# Patient Record
Sex: Female | Born: 1965 | Race: White | Hispanic: No | Smoking: Former smoker
Health system: Southern US, Community
[De-identification: ages and names within clinical notes are randomized; demographics above are authoritative.]

## PROBLEM LIST (undated history)

## (undated) DIAGNOSIS — IMO0001 Reserved for inherently not codable concepts without codable children: Secondary | ICD-10-CM

## (undated) DIAGNOSIS — C801 Malignant (primary) neoplasm, unspecified: Secondary | ICD-10-CM

## (undated) DIAGNOSIS — C3490 Malignant neoplasm of unspecified part of unspecified bronchus or lung: Secondary | ICD-10-CM

## (undated) DIAGNOSIS — IMO0002 Reserved for concepts with insufficient information to code with codable children: Secondary | ICD-10-CM

## (undated) HISTORY — DX: Malignant (primary) neoplasm, unspecified: C80.1

## (undated) HISTORY — PX: TUBAL LIGATION: SHX77

## (undated) HISTORY — DX: Reserved for inherently not codable concepts without codable children: IMO0001

## (undated) HISTORY — DX: Reserved for concepts with insufficient information to code with codable children: IMO0002

---

## 2014-02-12 ENCOUNTER — Institutional Professional Consult (permissible substitution): Payer: Self-pay | Admitting: Emergency Medicine

## 2014-02-13 ENCOUNTER — Institutional Professional Consult (permissible substitution): Payer: Self-pay | Admitting: Emergency Medicine

## 2014-02-14 ENCOUNTER — Encounter: Payer: Self-pay | Admitting: Emergency Medicine

## 2014-02-14 ENCOUNTER — Encounter (INDEPENDENT_AMBULATORY_CARE_PROVIDER_SITE_OTHER): Payer: Self-pay

## 2014-02-14 ENCOUNTER — Ambulatory Visit (INDEPENDENT_AMBULATORY_CARE_PROVIDER_SITE_OTHER): Payer: BC Managed Care – PPO | Admitting: Emergency Medicine

## 2014-02-14 VITALS — BP 108/64 | HR 76 | Ht 67.0 in | Wt 112.6 lb

## 2014-02-14 DIAGNOSIS — R222 Localized swelling, mass and lump, trunk: Secondary | ICD-10-CM

## 2014-02-14 DIAGNOSIS — R918 Other nonspecific abnormal finding of lung field: Secondary | ICD-10-CM | POA: Insufficient documentation

## 2014-02-14 NOTE — Assessment & Plan Note (Signed)
Interesting case in that this may represent a second pulmonary primary cancer. If so then she might be a surgical candidate just as she was for her R submandibular gland. We can perform FOB to get tissue, but if we believe H&N cancer is distinct from the lung issue, then surgery referral would be appropriate. I will perform a PET scan at [facility] and then discuss her case at [employer]. Will contact her to decide next steps.

## 2014-02-14 NOTE — Progress Notes (Signed)
Subjective:    Patient ID: Alison Green, female    DOB: 11/14/1966, 48 y.o.   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 The plan is for XRT to that area. MRI 02/05/14 showed no evidence local spread. She is referred today regarding abnormal CT scan chest with a RLL nodule. She denies any cough, CP, dyspnea, or other resp symptoms.    Review of Systems  Constitutional: Positive for unexpected weight change. Negative for fever.  HENT: Negative for congestion, dental problem, ear pain, nosebleeds, postnasal drip, rhinorrhea, sinus pressure, sneezing, sore throat and trouble swallowing.   Eyes: Negative for redness and itching.  Respiratory: Negative for cough, chest tightness, shortness of breath and wheezing.   Cardiovascular: Negative for palpitations and leg swelling.  Gastrointestinal: Negative for nausea and vomiting.  Genitourinary: Negative for dysuria.  Musculoskeletal: Negative for joint swelling.  Skin: Negative for rash.  Neurological: Negative for headaches.  Hematological: Does not bruise/bleed easily.  Psychiatric/Behavioral: Negative for dysphoric mood. The patient is nervous/anxious.     Past Medical History  Diagnosis Date  . Cancer     submandibular     No family history on file.   History   Social History  . Marital Status: Married    Spouse Name: N/A    Number of Children: N/A  . Years of Education: N/A   Occupational History  . Not on file.   Social History Main Topics  . Smoking status: Former Smoker -- 0.50 packs/day for 10 years    Types: Cigarettes    Quit date: 01/27/2014  . Smokeless tobacco: Not on file  . Alcohol Use: No  . Drug Use: No  . Sexual Activity: Not on file   Other Topics Concern  . Not on file   Social History Narrative  . No narrative on file     Not on File   No outpatient prescriptions prior to visit.   No  facility-administered medications prior to visit.         Objective:   Physical Exam  Filed Vitals:   02/14/14 1451  BP: 108/64  Pulse: 76  Height: 5\' 7"  (1.702 m)  Weight: 112 lb 9.6 oz (51.075 kg)  SpO2: 99%   Gen: Pleasant, thin woman, well-nourished, in no distress,  normal affect  ENT: No lesions,  mouth clear,  oropharynx clear, no postnasal drip, healing R submandibular scar  Neck: No JVD, no TMG, no carotid bruits  Lungs: No use of accessory muscles, no dullness to percussion, clear without rales or rhonchi  Cardiovascular: RRR, heart sounds normal, no murmur or gallops, no peripheral edema  Musculoskeletal: No deformities, no cyanosis or clubbing  Neuro: alert, non focal  Skin: Warm, no lesions or rashes   CT chest Oval Linsey) 02/05/14 >> 2.7x3.0cm posterior RLL mass, a few other scattered smaller pulm nodules.      Assessment & Plan:  Right lower lobe lung mass Interesting case in that this may represent a second pulmonary primary cancer. If so then she might be a surgical candidate just as she was for her R submandibular gland. We can perform FOB to get tissue, but if we believe H&N cancer is distinct from the lung issue, then surgery referral would be appropriate. I will perform a PET scan at [facility] and then discuss her case at [employer]. Will contact her to decide next steps.

## 2014-02-14 NOTE — Patient Instructions (Signed)
We will perform a PET scan at [facility] Follow with Dr Lamonte Sakai in 3-4 weeks We will discuss your case at [place]. We may decide to refer you to see Thoracic Surgery. Dr Lamonte Sakai will contact you to discuss these plans.

## 2014-03-01 ENCOUNTER — Telehealth: Payer: Self-pay | Admitting: Emergency Medicine

## 2014-03-01 NOTE — Telephone Encounter (Signed)
Discussed PET results and Thoracic Conference meeting with her. Consensus was that she should have a biopsy of the lung mass before we would consider surgical resection. We need to set up bronchoscopy with the Resp office for next week M, W, or F am. Prefer Friday am at [facility]. No TB risk. I do need fluoro. Call them first thing next week.

## 2014-03-01 NOTE — Telephone Encounter (Addendum)
I spoke with the Alison Green and she states she had a PET scan done in feb at [facility] and has not heard anything from Dr. Lamonte Sakai about the results. Dr. Lamonte Sakai I printed report from pacs system and gave to [facility]. Please advise. [place] Bing, CMA

## 2014-03-04 NOTE — Telephone Encounter (Signed)
Will advise pt and RB that Bronch has been scheduled for 03/08/14 @ 8:00 at [facility]. Nothing further needed at this time

## 2014-03-08 ENCOUNTER — Encounter (HOSPITAL_COMMUNITY)
Admission: RE | Disposition: A | Discharge status: Home / Self Care | Payer: Self-pay | Source: Ambulatory Visit | Attending: Emergency Medicine

## 2014-03-08 ENCOUNTER — Encounter (HOSPITAL_COMMUNITY): Payer: Self-pay

## 2014-03-08 ENCOUNTER — Ambulatory Visit (HOSPITAL_COMMUNITY)
Admission: RE | Admit: 2014-03-08 | Discharge: 2014-03-08 | Disposition: A | Discharge status: Home / Self Care | Payer: BC Managed Care – PPO | Source: Ambulatory Visit | Attending: Emergency Medicine | Admitting: Emergency Medicine

## 2014-03-08 ENCOUNTER — Ambulatory Visit (HOSPITAL_COMMUNITY): Payer: BC Managed Care – PPO

## 2014-03-08 DIAGNOSIS — R918 Other nonspecific abnormal finding of lung field: Secondary | ICD-10-CM

## 2014-03-08 DIAGNOSIS — R222 Localized swelling, mass and lump, trunk: Secondary | ICD-10-CM | POA: Insufficient documentation

## 2014-03-08 DIAGNOSIS — F17200 Nicotine dependence, unspecified, uncomplicated: Secondary | ICD-10-CM | POA: Insufficient documentation

## 2014-03-08 HISTORY — PX: VIDEO BRONCHOSCOPY: SHX5072

## 2014-03-08 SURGERY — BRONCHOSCOPY, WITH FLUOROSCOPY
Anesthesia: Moderate Sedation | Laterality: Bilateral

## 2014-03-08 MED ORDER — LIDOCAINE HCL 2 % EX GEL
CUTANEOUS | Status: DC | PRN
  Administered 2014-03-08: 1

## 2014-03-08 MED ORDER — MIDAZOLAM HCL 5 MG/ML IJ SOLN
INTRAMUSCULAR | Status: AC
  Filled 2014-03-08: qty 2

## 2014-03-08 MED ORDER — FENTANYL CITRATE 0.05 MG/ML IJ SOLN
INTRAMUSCULAR | Status: AC
  Filled 2014-03-08: qty 4

## 2014-03-08 MED ORDER — FENTANYL CITRATE 0.05 MG/ML IJ SOLN
INTRAMUSCULAR | Status: DC | PRN
  Administered 2014-03-08: 50 ug via INTRAVENOUS
  Administered 2014-03-08: 100 ug via INTRAVENOUS
  Administered 2014-03-08: 50 ug via INTRAVENOUS

## 2014-03-08 MED ORDER — LIDOCAINE HCL (PF) 1 % IJ SOLN
INTRAMUSCULAR | Status: DC | PRN
  Administered 2014-03-08: 6 mL

## 2014-03-08 MED ORDER — MIDAZOLAM HCL 10 MG/2ML IJ SOLN
INTRAMUSCULAR | Status: DC | PRN
  Administered 2014-03-08: 2 mg via INTRAVENOUS
  Administered 2014-03-08: 3 mg via INTRAVENOUS

## 2014-03-08 MED ORDER — PHENYLEPHRINE HCL 0.25 % NA SOLN
NASAL | Status: DC | PRN
  Administered 2014-03-08: 2 via NASAL

## 2014-03-08 NOTE — Discharge Instructions (Signed)
Flexible Bronchoscopy, Care After These instructions give you information on caring for yourself after your procedure. Your doctor may also give you more specific instructions. Call your doctor if you have any problems or questions after your procedure. HOME CARE  Do not eat or drink anything for 2 hours after your procedure. If you try to eat or drink before the medicine wears off, food or drink could go into your lungs. You could also burn yourself.   After 2 hours have passed and when you can cough and gag normally, you may eat soft food and drink liquids slowly.  The day after the test, you may eat your normal diet.  You may do your normal activities.  Keep all doctor visits. GET HELP RIGHT AWAY IF:  You get more and more short of breath.  You get lightheaded.  You feel like you are going to pass out (faint).  You have chest pain.  You have new problems that worry you.  You cough up more than a little blood.  You cough up more blood than before. MAKE SURE YOU:  Understand these instructions.  Will watch your condition.  Will get help right away if you are not doing well or get worse. Document Released: 10/10/2009 Document Revised: 10/03/2013 Document Reviewed: 08/17/2013 [facility] Patient Information 2014 [place].  Please call for any questions or concerns. [phone]

## 2014-03-08 NOTE — H&P (View-Only) (Signed)
Subjective:    Patient ID: Alison Green, female    DOB: 20-Jan-1966, 48 y.o.   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 The plan is for XRT to that area. MRI 02/05/14 showed no evidence local spread. She is referred today regarding abnormal CT scan chest with a RLL nodule. She denies any cough, CP, dyspnea, or other resp symptoms.    Review of Systems  Constitutional: Positive for unexpected weight change. Negative for fever.  HENT: Negative for congestion, dental problem, ear pain, nosebleeds, postnasal drip, rhinorrhea, sinus pressure, sneezing, sore throat and trouble swallowing.   Eyes: Negative for redness and itching.  Respiratory: Negative for cough, chest tightness, shortness of breath and wheezing.   Cardiovascular: Negative for palpitations and leg swelling.  Gastrointestinal: Negative for nausea and vomiting.  Genitourinary: Negative for dysuria.  Musculoskeletal: Negative for joint swelling.  Skin: Negative for rash.  Neurological: Negative for headaches.  Hematological: Does not bruise/bleed easily.  Psychiatric/Behavioral: Negative for dysphoric mood. The patient is nervous/anxious.     Past Medical History  Diagnosis Date  . Cancer     submandibular     No family history on file.   History   Social History  . Marital Status: Married    Spouse Name: N/A    Number of Children: N/A  . Years of Education: N/A   Occupational History  . Not on file.   Social History Main Topics  . Smoking status: Former Smoker -- 0.50 packs/day for 10 years    Types: Cigarettes    Quit date: 01/27/2014  . Smokeless tobacco: Not on file  . Alcohol Use: No  . Drug Use: No  . Sexual Activity: Not on file   Other Topics Concern  . Not on file   Social History Narrative  . No narrative on file     Not on File   No outpatient prescriptions prior to visit.   No  facility-administered medications prior to visit.         Objective:   Physical Exam  Filed Vitals:   02/14/14 1451  BP: 108/64  Pulse: 76  Height: 5\' 7"  (1.702 m)  Weight: 112 lb 9.6 oz (51.075 kg)  SpO2: 99%   Gen: Pleasant, thin woman, well-nourished, in no distress,  normal affect  ENT: No lesions,  mouth clear,  oropharynx clear, no postnasal drip, healing R submandibular scar  Neck: No JVD, no TMG, no carotid bruits  Lungs: No use of accessory muscles, no dullness to percussion, clear without rales or rhonchi  Cardiovascular: RRR, heart sounds normal, no murmur or gallops, no peripheral edema  Musculoskeletal: No deformities, no cyanosis or clubbing  Neuro: alert, non focal  Skin: Warm, no lesions or rashes   CT chest Oval Linsey) 02/05/14 >> 2.7x3.0cm posterior RLL mass, a few other scattered smaller pulm nodules.      Assessment & Plan:  Right lower lobe lung mass Interesting case in that this may represent a second pulmonary primary cancer. If so then she might be a surgical candidate just as she was for her R submandibular gland. We can perform FOB to get tissue, but if we believe H&N cancer is distinct from the lung issue, then surgery referral would be appropriate. I will perform a PET scan at [facility] and then discuss her case at [employer]. Will contact her to decide next steps.

## 2014-03-08 NOTE — Interval H&P Note (Signed)
PCCM Interval  No new issues reported. Pt understands the procedure and agrees to proceed.  Filed Vitals:   03/08/14 0755 03/08/14 0800 03/08/14 0805 03/08/14 0809  BP:  116/32 119/74   Pulse: 66 61  59  Resp: 10 10 19 13   SpO2: 100% 100% 100% 100%   Baltazar Apo, MD, PhD 03/08/2014, 8:18 AM [place] [facility] [phone] or if no answer [phone]

## 2014-03-08 NOTE — Progress Notes (Signed)
Video bronchoscopy procedure performed. Brushing intervention performed. Biopsy intervention performed. Wang needle biopsy intervention done. Washing intervention done.  Baltazar Apo, MD, PhD 03/11/2014, 9:35 AM [place] [facility] [phone] or if no answer [phone]

## 2014-03-08 NOTE — Op Note (Signed)
Video Bronchoscopy Procedure Note  Date of Operation: 03/08/2014  Pre-op Diagnosis: LLL mass        Post-op Diagnosis: Same  Surgeon: Baltazar Apo  Assistants: none  Anesthesia: conscious sedation, moderate sedation  Meds Given: fentanyl 257mcg, versed 5mg  in divided doses, 1% lidocaine 24cc total  Operation: Flexible video fiberoptic bronchoscopy and biopsies.  Estimated Blood Loss: 62IW  Complications: none noted  Indications and History: Alison Green is 48 y.o. with history of smoking and LLL lung mass noted on CT scan.  Recommendation was to perform video fiberoptic bronchoscopy with biopsies. The risks, benefits, complications, treatment options and expected outcomes were discussed with the patient.  The possibilities of pneumothorax, pneumonia, reaction to medication, pulmonary aspiration, perforation of a viscus, bleeding, failure to diagnose a condition and creating a complication requiring transfusion or operation were discussed with the patient who freely signed the consent.    Description of Procedure: The patient was seen in the Preoperative Area, was examined and was deemed appropriate to proceed.  The patient was taken to Endoscopy Suite at [facility], identified as Linward Natal and the procedure verified as Flexible Video Fiberoptic Bronchoscopy.  A Time Out was held and the above information confirmed.   Conscious sedation was initiated as indicated above. The video fiberoptic bronchoscope was introduced via the L nare and a general inspection was performed which showed normal cords, normal trachea, normal main carina. The R sided airways were inspected and showed normal RUL, BI, RML and RLL. The L side was then inspected. The LLL, Lingular and LUL airways were normal. Transbronchial brushings were performed under fluoroscopic guidance in several subsegments of the LLL for cytology. Transbronchial Wang needle bx were performed in the same segments. Transbronchial forceps biopsies  were performed under fluoro guidance. There was some initial moderate bleeding that stopped quickly. Finally BAL performed in the LLL to be sent for cytology. The patient tolerated the procedure well. The bronchoscope was removed. There were no obvious complications.   Samples: 1. Transbronchial brushings from LLL 2. Wang needle biopsies from LLL 3. Transbronchial forceps biopsies from LLL 4. Bronchoalveolar Lavage from LLL  Plans:  We will review the cytology, pathology and microbiology results with the patient when they become available.  Outpatient followup will be with Dr Lamonte Sakai.    Baltazar Apo, MD, PhD 03/08/2014, 9:14 AM [place] [facility] [phone] or if no answer [phone]

## 2014-03-10 LAB — CULTURE, BAL-QUANTITATIVE: GRAM STAIN: NONE SEEN

## 2014-03-10 LAB — CULTURE, BAL-QUANTITATIVE W GRAM STAIN

## 2014-03-11 ENCOUNTER — Encounter (HOSPITAL_COMMUNITY): Payer: Self-pay | Admitting: Emergency Medicine

## 2014-03-12 ENCOUNTER — Encounter: Payer: BC Managed Care – PPO | Admitting: Emergency Medicine

## 2014-03-13 ENCOUNTER — Telehealth: Payer: Self-pay | Admitting: Emergency Medicine

## 2014-03-13 NOTE — Telephone Encounter (Signed)
Called spoke with pt. She reports the 1st bronch she had done did not go right. Was told the 2nd one she is schedule for would be free of charge. She reports registration called ysterday wanting money. She wants this clarified. Please advise RB thanks

## 2014-03-13 NOTE — Telephone Encounter (Signed)
Called and spoke with pt. She is aware. Nothing further needed

## 2014-03-13 NOTE — Telephone Encounter (Signed)
Tell her that I will work on this. I have every intention of getting the cost taken care of.

## 2014-03-14 MED ORDER — PHENYLEPHRINE HCL 0.25 % NA SOLN
1.0000 | Freq: Four times a day (QID) | NASAL | Status: DC | PRN
  Filled 2014-03-14: qty 15

## 2014-03-14 MED ORDER — LIDOCAINE HCL 2 % EX GEL: Freq: Once | CUTANEOUS | Status: DC

## 2014-03-15 ENCOUNTER — Ambulatory Visit (HOSPITAL_COMMUNITY)
Admission: RE | Admit: 2014-03-15 | Discharge: 2014-03-15 | Disposition: A | Discharge status: Home / Self Care | Payer: BC Managed Care – PPO | Source: Ambulatory Visit | Attending: Emergency Medicine | Admitting: Emergency Medicine

## 2014-03-15 ENCOUNTER — Ambulatory Visit (HOSPITAL_COMMUNITY): Payer: BC Managed Care – PPO

## 2014-03-15 ENCOUNTER — Encounter (HOSPITAL_COMMUNITY): Payer: Self-pay | Admitting: Respiratory Therapy

## 2014-03-15 ENCOUNTER — Encounter (HOSPITAL_COMMUNITY)
Admission: RE | Disposition: A | Discharge status: Home / Self Care | Payer: BC Managed Care – PPO | Source: Ambulatory Visit | Attending: Emergency Medicine

## 2014-03-15 DIAGNOSIS — C3430 Malignant neoplasm of lower lobe, unspecified bronchus or lung: Secondary | ICD-10-CM | POA: Insufficient documentation

## 2014-03-15 DIAGNOSIS — R222 Localized swelling, mass and lump, trunk: Secondary | ICD-10-CM

## 2014-03-15 DIAGNOSIS — F17200 Nicotine dependence, unspecified, uncomplicated: Secondary | ICD-10-CM | POA: Insufficient documentation

## 2014-03-15 DIAGNOSIS — C080 Malignant neoplasm of submandibular gland: Secondary | ICD-10-CM | POA: Insufficient documentation

## 2014-03-15 DIAGNOSIS — C3490 Malignant neoplasm of unspecified part of unspecified bronchus or lung: Secondary | ICD-10-CM

## 2014-03-15 DIAGNOSIS — R918 Other nonspecific abnormal finding of lung field: Secondary | ICD-10-CM | POA: Diagnosis present

## 2014-03-15 HISTORY — PX: VIDEO BRONCHOSCOPY: SHX5072

## 2014-03-15 HISTORY — DX: Malignant neoplasm of unspecified part of unspecified bronchus or lung: C34.90

## 2014-03-15 SURGERY — BRONCHOSCOPY, WITH FLUOROSCOPY
Anesthesia: Moderate Sedation | Laterality: Bilateral

## 2014-03-15 MED ORDER — LIDOCAINE HCL 2 % EX GEL: Freq: Once | CUTANEOUS | Status: DC

## 2014-03-15 MED ORDER — PHENYLEPHRINE HCL 0.25 % NA SOLN
1.0000 | Freq: Four times a day (QID) | NASAL | Status: DC | PRN
  Filled 2014-03-15: qty 15

## 2014-03-15 MED ORDER — FENTANYL CITRATE 0.05 MG/ML IJ SOLN
INTRAMUSCULAR | Status: DC | PRN
  Administered 2014-03-15 (×2): 25 ug via INTRAVENOUS
  Administered 2014-03-15: 75 ug via INTRAVENOUS

## 2014-03-15 MED ORDER — SODIUM CHLORIDE 0.9 % IV SOLN
INTRAVENOUS | Status: DC
  Administered 2014-03-15: 11:00:00 via INTRAVENOUS

## 2014-03-15 MED ORDER — MIDAZOLAM HCL 10 MG/2ML IJ SOLN
INTRAMUSCULAR | Status: DC | PRN
  Administered 2014-03-15: 2 mg via INTRAVENOUS
  Administered 2014-03-15: 3 mg via INTRAVENOUS
  Administered 2014-03-15: 2 mg via INTRAVENOUS

## 2014-03-15 MED ORDER — MIDAZOLAM HCL 5 MG/ML IJ SOLN
INTRAMUSCULAR | Status: AC
  Filled 2014-03-15: qty 2

## 2014-03-15 MED ORDER — FENTANYL CITRATE 0.05 MG/ML IJ SOLN
INTRAMUSCULAR | Status: AC
  Filled 2014-03-15: qty 4

## 2014-03-15 NOTE — Progress Notes (Signed)
Video Bronchoscopy done  Intervention Bronchial washing done Intervention Bronchial biopsy  X 2 areas Intervention Bronchial bushings    Procedure tolerated well  Baltazar Apo, MD, PhD 03/15/2014, 7:18 PM [place] [facility] [phone] or if no answer [phone]

## 2014-03-15 NOTE — H&P (View-Only) (Signed)
Subjective:    Patient ID: Alison Green, female    DOB: [birth year], 48 y.o.   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 The plan is for XRT to that area. MRI 02/05/14 showed no evidence local spread. She is referred today regarding abnormal CT scan chest with a RLL nodule. She denies any cough, CP, dyspnea, or other resp symptoms.    Review of Systems  Constitutional: Positive for unexpected weight change. Negative for fever.  HENT: Negative for congestion, dental problem, ear pain, nosebleeds, postnasal drip, rhinorrhea, sinus pressure, sneezing, sore throat and trouble swallowing.   Eyes: Negative for redness and itching.  Respiratory: Negative for cough, chest tightness, shortness of breath and wheezing.   Cardiovascular: Negative for palpitations and leg swelling.  Gastrointestinal: Negative for nausea and vomiting.  Genitourinary: Negative for dysuria.  Musculoskeletal: Negative for joint swelling.  Skin: Negative for rash.  Neurological: Negative for headaches.  Hematological: Does not bruise/bleed easily.  Psychiatric/Behavioral: Negative for dysphoric mood. The patient is nervous/anxious.     Past Medical History  Diagnosis Date  . Cancer     submandibular     No family history on file.   History   Social History  . Marital Status: Married    Spouse Name: N/A    Number of Children: N/A  . Years of Education: N/A   Occupational History  . Not on file.   Social History Main Topics  . Smoking status: Former Smoker -- 0.50 packs/day for 10 years    Types: Cigarettes    Quit date: 01/27/2014  . Smokeless tobacco: Not on file  . Alcohol Use: No  . Drug Use: No  . Sexual Activity: Not on file   Other Topics Concern  . Not on file   Social History Narrative  . No narrative on file     Not on File   No outpatient prescriptions prior to visit.   No  facility-administered medications prior to visit.         Objective:   Physical Exam  Filed Vitals:   02/14/14 1451  BP: 108/64  Pulse: 76  Height: 5\' 7"  (1.702 m)  Weight: 112 lb 9.6 oz (51.075 kg)  SpO2: 99%   Gen: Pleasant, thin woman, well-nourished, in no distress,  normal affect  ENT: No lesions,  mouth clear,  oropharynx clear, no postnasal drip, healing R submandibular scar  Neck: No JVD, no TMG, no carotid bruits  Lungs: No use of accessory muscles, no dullness to percussion, clear without rales or rhonchi  Cardiovascular: RRR, heart sounds normal, no murmur or gallops, no peripheral edema  Musculoskeletal: No deformities, no cyanosis or clubbing  Neuro: alert, non focal  Skin: Warm, no lesions or rashes   CT chest Oval Linsey) 02/05/14 >> 2.7x3.0cm posterior RLL mass, a few other scattered smaller pulm nodules.      Assessment & Plan:  Right lower lobe lung mass Interesting case in that this may represent a second pulmonary primary cancer. If so then she might be a surgical candidate just as she was for her R submandibular gland. We can perform FOB to get tissue, but if we believe H&N cancer is distinct from the lung issue, then surgery referral would be appropriate. I will perform a PET scan at [facility] and then discuss her case at [employer]. Will contact her to decide next steps.

## 2014-03-15 NOTE — Op Note (Signed)
Video Bronchoscopy Procedure Note  Date of Operation: 03/15/2014  Pre-op Diagnosis: RLL mass  Post-op Diagnosis: Same  Surgeon: Baltazar Apo  Assistants: none  Anesthesia: conscious sedation, moderate sedation  Meds Given: fentanyl 130mcg, versed 7mg  in divided doses, 1% lidocaine 25cc total  Operation: Flexible video fiberoptic bronchoscopy and biopsies.  Estimated Blood Loss: 62XB  Complications: none noted  Indications and History: Alison Green is 48 y.o. with history of a R submandibular gland tumor that was dx as high grade mucoepidermal carcinoma. She also has a rounded RLL mass on CT chest from 02/05/14.  Recommendation was to perform video fiberoptic bronchoscopy with biopsies. The risks, benefits, complications, treatment options and expected outcomes were discussed with the patient.  The possibilities of pneumothorax, pneumonia, reaction to medication, pulmonary aspiration, perforation of a viscus, bleeding, failure to diagnose a condition and creating a complication requiring transfusion or operation were discussed with the patient who freely signed the consent.    Description of Procedure: The patient was seen in the Preoperative Area, was examined and was deemed appropriate to proceed.  The patient was taken to [facility], identified as Alison Green and the procedure verified as Flexible Video Fiberoptic Bronchoscopy.  A Time Out was held and the above information confirmed.   Conscious sedation was initiated as indicated above. The video fiberoptic bronchoscope was introduced via the L nare and a general inspection was performed which showed normal cords, normal trachea, normal main carina. The R sided airways were inspected and showed normal RUL, BI, RML. There was a very small hypopigmented lesion at the orifice of the medial-basal segment of the RLL that was not seen on her prior FOB 03/08/14. The L side was then inspected. The LLL, Lingular and LUL airways were normal.  Endobronchial forceps biopsies were performed on the RLL area of hypopigmentation for pathology. Then under fluoroscopic guidance transbronchial brushings and transbronchial biopsies were performed in the RLL.  Finally endobronchial washings were performed in the RLL to be sent for cytology. The patient tolerated the procedure well. The bronchoscope was removed. There were no obvious complications. A CXR was ordered and is pending.   Samples: 1. Transbronchial brushings from RLL 2. Transbronchial biopsies from RLL 3. Endobronchial forceps biopsies from RLL 4. Bronchial washings from RLL  Plans:  We will review the cytology, pathology and results with the patient when they become available.  Outpatient followup will be with Dr Lamonte Sakai.    Baltazar Apo, MD, PhD 03/15/2014, 11:55 AM [place] [facility] [phone] or if no answer [phone]

## 2014-03-15 NOTE — Interval H&P Note (Signed)
PCCM Interval Note  Alison Green presents for repeat biopsies via FOB. She tolerated the FOB from 3/13 without difficulty.  No new issues reported.  As expected, all of her samples from her LLL biopsies and brushings are negative  Filed Vitals:   03/15/14 1040 03/15/14 1045 03/15/14 1047  Pulse: 57 71 61  Resp: 12 14 17   SpO2: 100% 100% 100%   Gen: Pleasant, thin, in no distress,  normal affect  ENT: No lesions,  mouth clear,  oropharynx clear, no postnasal drip  Neck: No JVD, no TMG, no carotid bruits  Lungs: No use of accessory muscles, clear without rales or rhonchi  Cardiovascular: RRR, heart sounds normal, no murmur or gallops, no peripheral edema  Musculoskeletal: No deformities, no cyanosis or clubbing  Neuro: alert, non focal  Skin: Warm, no lesions or rashes  Plan:  Repeat FOB with RLL biopsies and washings.   Baltazar Apo, MD, PhD 03/15/2014, 11:09 AM [place] [facility] [phone] or if no answer [phone]

## 2014-03-15 NOTE — Discharge Instructions (Signed)
Flexible Bronchoscopy, Care After These instructions give you information on caring for yourself after your procedure. Your doctor may also give you more specific instructions. Call your doctor if you have any problems or questions after your procedure. HOME CARE  Do not eat or drink anything for 2 hours after your procedure. If you try to eat or drink before the medicine wears off, food or drink could go into your lungs. You could also burn yourself.  After 2 hours have passed and when you can cough and gag normally, you may eat soft food and drink liquids slowly.  The day after the test, you may eat your normal diet.  You may do your normal activities.  Keep all doctor visits. GET HELP RIGHT AWAY IF:  You get more and more short of breath.  You get lightheaded.  You feel like you are going to pass out (faint).  You have chest pain.  You have new problems that worry you.  You cough up more than a little blood.  You cough up more blood than before. MAKE SURE YOU:  Understand these instructions.  Will watch your condition.  Will get help right away if you are not doing well or get worse. Document Released: 10/10/2009 Document Revised: 10/03/2013 Document Reviewed: 08/17/2013 [facility] Patient Information 2014 [place].  Nothing to eat or drink until   2:00 pm Today   03/15/2014

## 2014-03-18 ENCOUNTER — Encounter (HOSPITAL_COMMUNITY): Payer: Self-pay | Admitting: Emergency Medicine

## 2014-03-19 ENCOUNTER — Telehealth: Payer: Self-pay | Admitting: Emergency Medicine

## 2014-03-19 NOTE — Telephone Encounter (Signed)
Reviewed path with her by phone - shows mucoepidermal tumor that matches her submandibular gland. I will forward this info to Dr Dorisann Frames, [facility]

## 2014-03-20 ENCOUNTER — Telehealth: Payer: Self-pay | Admitting: Emergency Medicine

## 2014-03-20 NOTE — Telephone Encounter (Signed)
Last OV note printed and faxed [phone] Attn: Manuela Schwartz. They are aware these notes being faxed. Nothing further needed

## 2014-03-27 MED ORDER — LEVOFLOXACIN 750 MG PO TABS: 750.0000 mg | ORAL_TABLET | Freq: Every day | ORAL | Status: DC

## 2014-03-27 NOTE — Telephone Encounter (Signed)
Pt aware levaquin [phone] sent to pharm.  Pt has already had chest cxr at urgent care and will fax a copy of the results to [facility]. Pt also stated followed up with Dr. Bobby Rumpf at [facility] on Friday 03/22/14. Informed her I would call there and f/u that Dr. Orlene Erm is involved and aware pt has been seen, he has reviewed her results of biopsy

## 2014-03-27 NOTE — Telephone Encounter (Signed)
ATC pt x2 - line busy both times.  WCB.

## 2014-04-04 LAB — FUNGUS CULTURE W SMEAR: Fungal Smear: NONE SEEN

## 2014-04-15 ENCOUNTER — Encounter (HOSPITAL_COMMUNITY): Payer: Self-pay

## 2014-04-19 ENCOUNTER — Encounter (HOSPITAL_COMMUNITY): Payer: Self-pay

## 2014-04-21 LAB — AFB CULTURE WITH SMEAR (NOT AT ARMC): Acid Fast Smear: NONE SEEN

## 2014-04-23 ENCOUNTER — Encounter: Payer: Self-pay | Admitting: Radiation Oncology

## 2014-04-23 NOTE — Progress Notes (Signed)
Thoracic Location of Tumor / Histology:Right Lower lobe lung Cancer  Patient presented  months ago with symptoms Alison Green states she didn't have a PCP and decided to have a physical especially after developing a palpable mass on right side of neck that became painful.Prior to this she had no health issues.  Biopsies of  (if applicable) revealed: Diagnosis 03/15/14:1. Lung, transbronchial biopsy, Right lower lobe- MUCOEPIDERMOID CARCINOMA.- LYMPHOVASCULAR INVASION IS IDENTIFIED. Diagnosis(continued)- SEE COMMENT.2. Endobronchial biopsy, Right lower lobe medial basal segment- MUCOEPIDERMOID CARCINOMA. Dr. Collene Gobble   Tobacco/Marijuana/Snuff/ETOH use: former smoker 10 years 1/2ppd/ Quit March 23, 2014  Past/Anticipated interventions by cardiothoracic surgery, if DXA:JOIN biopsy and bronchoscopy  On 03/08/2014 and 3/201/2015  Past/Anticipated interventions by medical oncology, if any: Dr.Lewis is medical oncologist in [place].  Signs/Symptoms  Weight changes, if any: weight maintained.  Respiratory complaints, if any: no  Hemoptysis, if any: no  Pain issues, if any:No   SAFETY ISSUES:  Prior radiation? No  Pacemaker/ICD? No  Possible current pregnancy? No  Is the patient on methotrexate? No  Current Complaints / other details:  Married, 2 adult children ages 1 and 19.  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

## 2014-04-24 ENCOUNTER — Ambulatory Visit
Admission: RE | Admit: 2014-04-24 | Discharge: 2014-04-24 | Disposition: A | Discharge status: Home / Self Care | Payer: BC Managed Care – PPO | Source: Ambulatory Visit | Attending: Radiation Oncology | Admitting: Radiation Oncology

## 2014-04-24 ENCOUNTER — Encounter: Payer: Self-pay | Admitting: Radiation Oncology

## 2014-04-24 ENCOUNTER — Ambulatory Visit: Payer: BC Managed Care – PPO | Admitting: Radiation Oncology

## 2014-04-24 VITALS — BP 114/67 | HR 63 | Temp 98.7°F | Wt 111.3 lb

## 2014-04-24 DIAGNOSIS — C3431 Malignant neoplasm of lower lobe, right bronchus or lung: Secondary | ICD-10-CM

## 2014-04-24 DIAGNOSIS — C3430 Malignant neoplasm of lower lobe, unspecified bronchus or lung: Secondary | ICD-10-CM | POA: Insufficient documentation

## 2014-04-24 DIAGNOSIS — Z510 Encounter for antineoplastic radiation therapy: Secondary | ICD-10-CM | POA: Insufficient documentation

## 2014-04-24 HISTORY — DX: Malignant neoplasm of unspecified part of unspecified bronchus or lung: C34.90

## 2014-04-24 NOTE — Progress Notes (Signed)
Please see the Nurse Progress Note in the MD Initial Consult Encounter for this patient. 

## 2014-04-25 DIAGNOSIS — C3430 Malignant neoplasm of lower lobe, unspecified bronchus or lung: Secondary | ICD-10-CM | POA: Insufficient documentation

## 2014-04-25 NOTE — Progress Notes (Addendum)
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 female  palpated a right neck mass. She noticed this last year. This was associated with pain to the point where she was having trismus. She was referred to ENT. In January she had resection of a submandibular mass. The pathology showed a 2.3 cm mucoid epidermoid carcinoma which was high grade. Perineural and lymphovascular invasion was noted. Zero out of two lymph nodes were positive for malignancy. Tumor was present at multiple non-oriented inked edges. At that point she had lost about 10 pounds in the past 12 months. Further staging studies were performed including a PET scan which was performed on 02/18/2014. This showed a 2.7 x 3.2 cm hypermetabolic mass in the right lower lobe. This had an SUV of 16.9. A small adjacent nodule measuring 6 mm had no hypermetabolic activity. No evidence of metastatic disease was noted. She underwent bronchoscopy and biopsy of the right lower lobe lesions on 03/15/2014 which showed mucoepidermoid carcinoma. 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 She is present with her husband today. She has no complaints. Per Dr. Jaclyn Shaggy notes she does have recent neck imaging which shows a stable jugulodigastric node with no evidence of recurrent disease. Her case was discussed at [facility] tumor board as well as our head and neck tumor board. Stereotactic radiation to the lung mass with possible radiation to the right neck was discussed. She is referred to me by Dr. Bobby Rumpf for consideration of stereotactic radiation to  this mass. She has no cough no shortness of breath and no hemoptysis.  PREVIOUS RADIATION THERAPY: No  PAST MEDICAL HISTORY:  has a past medical history of Lung cancer (03/15/14) and Cancer.    PAST SURGICAL HISTORY: Past Surgical History  Procedure Laterality Date  . Tubal ligation    . Video bronchoscopy Bilateral 03/08/2014    Procedure: VIDEO BRONCHOSCOPY WITH FLUORO;  Surgeon: Collene Gobble, MD;  Location: [facility];  Service: Cardiopulmonary;  Laterality: Bilateral;  . Video bronchoscopy Bilateral 03/15/2014    Procedure: VIDEO BRONCHOSCOPY WITH FLUORO;  Surgeon: Collene Gobble, MD;  Location: [facility];  Service: Cardiopulmonary;  Laterality: Bilateral;    FAMILY HISTORY: No family history on file.  SOCIAL HISTORY:  History  Substance Use Topics  . Smoking status: Former Smoker -- 0.50 packs/day for 10 years    Types: Cigarettes    Quit date: 01/27/2014  . Smokeless tobacco: Not on file  . Alcohol Use: No    ALLERGIES: Review of patient's allergies indicates no known allergies.  MEDICATIONS:  Current Outpatient Prescriptions  Medication Sig Dispense Refill  . buPROPion (ZYBAN) 150 MG 12 hr tablet Take 150 mg by mouth 2 (two) times a week.       . diphenhydrAMINE (SIMPLY SLEEP) 25 MG tablet Take 25 mg by mouth at bedtime as needed for sleep.       No current facility-administered medications for this encounter.    REVIEW OF SYSTEMS:  A 15 point review of systems is documented in the electronic medical record. This was obtained by the  nursing staff. However, I reviewed this with the patient to discuss relevant findings and make appropriate changes.  Pertinent items are noted in HPI.  PHYSICAL EXAM:  Filed Vitals:   04/24/14 1210  BP: 114/67  Pulse: 63  Temp: 98.7 F (37.1 C)  .111 lb 4.8 oz (50.485 kg). Pleasant female in no distress sitting comfortably examining table. Alert and oriented x3. 5 out of 5 strength bilaterally.   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 We discussed her solitary pulmonary metastases. In light of the aggressive nature of this disease and its poor response to systemic chemotherapy we discussed treatment of this solitary metastases. I think this can be accomplished with minimal toxicity to her. Given its central location and 5-10 fractions of radiation given in a stereotactic fashion is a reasonable treatment plan. I fear the toxicity of systemic treatment with the low likelihood of response would put this lung at risk for collapse and likely be ineffective in preventing further metastatic disease. Obviously as I discussed with she and her husband and she had a more typical non-small cell lung cancer the standard would be to start with systemic chemotherapy but again the likelihood of response to systemic chemotherapy given this pathology is low. For that reason we discussed stereotactic radiation to this right lower lobe lesion. We discussed the use of  Respiratory compression and 4D CT. I actually is scheduled her for simulation following this appointment. She signed informed consent and agree to proceed forward. Risk and benefits of treatment were discussed with her and she was given the opportunity ask questions. I spent 60 minutes  face to face with the patient and more than 50% of that time was spent in counseling and/or coordination of care.  ADDENDUM: after review of her outside studies, it does appear she has several pulmonary metastases including 2 in the right lower lobe, 2 in the left lower lobe. This was reviewed at our multidisciplinary lung conference and she is not a candidate for resection of the masses given the bilateral nature. I discussed this with her and she still wishes to pursue SBRT to the right lower lobe masses give that they could cause airway obstruction  if they grew and she really does not have good systemic treatment options to prevent this growth.     ------------------------------------------------  Thea Silversmith, MD

## 2014-04-25 NOTE — Progress Notes (Signed)
[facility] Simulation and Treatment Planning Note   Name:  Alison Green MRN: [id number]   Date: 04/25/2014  DOB: 03-13-1966  Status:outpatient    DIAGNOSIS: Metastatic mucoepidermoid cancer to the right lower lobe.    CONSENT VERIFIED:yes   SET UP: Patient is setup supine   IMMOBILIZATION: The patient was immobilized using a Vac Loc bag and Abdominal Compression.   NARRATIVE:The patient was brought to the [facility].  Identity was confirmed.  All relevant records and images related to the planned course of therapy were reviewed.  Then, the patient was positioned in a stable reproducible clinical set-up for radiation therapy. Abdominal compression was applied by me.  4D CT images were obtained and reproducible breathing pattern was confirmed. Free breathing CT images were obtained.  Skin markings were placed.  The CT images were loaded into the planning software where the target and avoidance structures were contoured.  The radiation prescription was entered and confirmed.    TREATMENT PLANNING NOTE:  Treatment planning then occurred. I have requested : MLC's, isodose plan, basic dose calculation.  3 dimensional simulation is performed and dose volume histogram of the gross tumor volume, planning tumor volume and criticial normal structures including the spinal cord and lungs were analyzed and requested.  Special treatment procedure was performed due to high dose per fraction.  The patient will be monitored for increased risk of toxicity.  Daily imaging using cone beam CT will be used for target localization.

## 2014-04-25 NOTE — Addendum Note (Signed)
Encounter addended by: Arlyss Repress, RN on: 04/25/2014  1:28 PM<BR>     Documentation filed: Charges VN

## 2014-05-07 ENCOUNTER — Ambulatory Visit
Admission: RE | Admit: 2014-05-07 | Discharge: 2014-05-07 | Disposition: A | Discharge status: Home / Self Care | Payer: BC Managed Care – PPO | Source: Ambulatory Visit | Attending: Radiation Oncology | Admitting: Radiation Oncology

## 2014-05-07 ENCOUNTER — Ambulatory Visit: Payer: BC Managed Care – PPO | Admitting: Radiation Oncology

## 2014-05-07 DIAGNOSIS — C3430 Malignant neoplasm of lower lobe, unspecified bronchus or lung: Secondary | ICD-10-CM

## 2014-05-07 NOTE — Progress Notes (Signed)
I spoke with the patient and her husband about her treatment. I reviewed her plan and the proximity of high doses of radiation in and around some distal branches of the right lower lobe bronchus.  We discussed the implications for this. We discussed the presence of other nodules in her lungs. We discussed the pros and cons of surgery.  In the end she elected to proceed forward with treatment and accept the small but real risk of lower lobe collapse which would be unlikely to cause her to stop breathing but possibly could cause her to have a chronic cough.

## 2014-05-07 NOTE — Progress Notes (Signed)
No nurse note required as patient to be seen by dr.Wentworth only in office for discussion.

## 2014-05-08 ENCOUNTER — Ambulatory Visit: Payer: BC Managed Care – PPO

## 2014-05-09 ENCOUNTER — Ambulatory Visit
Admission: RE | Admit: 2014-05-09 | Discharge: 2014-05-09 | Disposition: A | Discharge status: Home / Self Care | Payer: BC Managed Care – PPO | Source: Ambulatory Visit | Attending: Radiation Oncology | Admitting: Radiation Oncology

## 2014-05-09 DIAGNOSIS — C3430 Malignant neoplasm of lower lobe, unspecified bronchus or lung: Secondary | ICD-10-CM

## 2014-05-09 NOTE — Progress Notes (Signed)
  Radiation Oncology         [phone] ________________________________  Name: Alison Green MRN: [id number]  Date: 05/09/2014  DOB: [birth year]  Stereotactic Body Radiotherapy Treatment Procedure Note  NARRATIVE:  Alison Green was brought to the stereotactic radiation treatment machine and placed supine on the CT couch. The patient was set up for stereotactic body radiotherapy on the body fix pillow.  3D TREATMENT PLANNING AND DOSIMETRY:  The patient's radiation plan was reviewed and approved prior to starting treatment.  It showed 3-dimensional radiation distributions overlaid onto the planning CT.  The [facility] for the target structures as well as the organs at risk were reviewed. The documentation of this is filed in the radiation oncology EMR.  SIMULATION VERIFICATION:  The patient underwent CT imaging on the treatment unit.  These were carefully aligned to document that the ablative radiation dose would cover the target volume and maximally spare the nearby organs at risk according to the planned distribution.  SPECIAL TREATMENT PROCEDURE: Alison Green received high dose ablative stereotactic body radiotherapy to the planned target volume without unforeseen complications. Treatment was delivered uneventfully. The high doses associated with stereotactic body radiotherapy and the significant potential risks require careful treatment set up and patient monitoring constituting a special treatment procedure   STEREOTACTIC TREATMENT MANAGEMENT:  Following delivery, the patient was evaluated clinically. The patient tolerated treatment without significant acute effects, and was discharged to home in stable condition.    PLAN: Continue treatment as planned.  ________________________________  Blair Promise, PhD, MD

## 2014-05-10 ENCOUNTER — Ambulatory Visit: Payer: BC Managed Care – PPO

## 2014-05-13 ENCOUNTER — Ambulatory Visit: Payer: BC Managed Care – PPO | Admitting: Radiation Oncology

## 2014-05-13 ENCOUNTER — Ambulatory Visit
Admission: RE | Admit: 2014-05-13 | Discharge: 2014-05-13 | Disposition: A | Discharge status: Home / Self Care | Payer: BC Managed Care – PPO | Source: Ambulatory Visit | Attending: Radiation Oncology | Admitting: Radiation Oncology

## 2014-05-13 ENCOUNTER — Encounter: Payer: Self-pay | Admitting: Radiation Oncology

## 2014-05-13 NOTE — Progress Notes (Signed)
  Radiation Oncology         [phone] ________________________________  Name: Alison Green MRN: [id number]  Date: 05/13/2014  DOB: Jun 18, 1966  Stereotactic Body Radiotherapy Treatment Procedure Note  NARRATIVE:  Alison Green was brought to the stereotactic radiation treatment machine and placed supine on the CT couch. The patient was set up for stereotactic body radiotherapy on the body fix pillow.  3D TREATMENT PLANNING AND DOSIMETRY:  The patient's radiation plan was reviewed and approved prior to starting treatment.  It showed 3-dimensional radiation distributions overlaid onto the planning CT.  The [facility] for the target structures as well as the organs at risk were reviewed. The documentation of this is filed in the radiation oncology EMR.  SIMULATION VERIFICATION:  The patient underwent CT imaging on the treatment unit.  These were carefully aligned to document that the ablative radiation dose would cover the  right lung target volume and maximally spare the nearby organs at risk according to the planned distribution.  SPECIAL TREATMENT PROCEDURE: Alison Green received high dose ablative stereotactic body radiotherapy to the planned target volume without unforeseen complications. Treatment was delivered uneventfully. She received an additional 1000 cGy for a cumulative dose of 2000 cGy in 2 sessions of a prescribed 5000 cGy in 5 sessions. The high doses associated with stereotactic body radiotherapy and the significant potential risks require careful treatment set up and patient monitoring constituting a special treatment procedure   STEREOTACTIC TREATMENT MANAGEMENT:  Following delivery, the patient was evaluated clinically. The patient tolerated treatment without significant acute effects, and was discharged to home in stable condition.    PLAN: Continue treatment as planned.  ------------------------------------------------        Rexene Edison, MD

## 2014-05-15 ENCOUNTER — Ambulatory Visit: Payer: BC Managed Care – PPO | Admitting: Radiation Oncology

## 2014-05-15 ENCOUNTER — Ambulatory Visit
Admission: RE | Admit: 2014-05-15 | Discharge: 2014-05-15 | Disposition: A | Discharge status: Home / Self Care | Payer: BC Managed Care – PPO | Source: Ambulatory Visit | Attending: Radiation Oncology | Admitting: Radiation Oncology

## 2014-05-15 DIAGNOSIS — C3430 Malignant neoplasm of lower lobe, unspecified bronchus or lung: Secondary | ICD-10-CM

## 2014-05-15 NOTE — Progress Notes (Signed)
  Radiation Oncology         (336) [phone] ________________________________  Name: Alison Green MRN: [id number]  Date: 05/15/2014  DOB: Sep 24, 1966  Stereotactic Body Radiotherapy Treatment Procedure Note  NARRATIVE:  Alison Green was brought to the stereotactic radiation treatment machine and placed supine on the CT couch. The patient was set up for stereotactic body radiotherapy on the body fix pillow.  3D TREATMENT PLANNING AND DOSIMETRY:  The patient's radiation plan was reviewed and approved prior to starting treatment.  It showed 3-dimensional radiation distributions overlaid onto the planning CT.  The [facility] for the target structures as well as the organs at risk were reviewed. The documentation of this is filed in the radiation oncology EMR.  SIMULATION VERIFICATION:  The patient underwent CT imaging on the treatment unit.  These were carefully aligned to document that the ablative radiation dose would cover the target volume and maximally spare the nearby organs at risk according to the planned distribution.  SPECIAL TREATMENT PROCEDURE: Alison Green received high dose ablative stereotactic body radiotherapy to the planned target volume without unforeseen complications. Treatment was delivered uneventfully. The high doses associated with stereotactic body radiotherapy and the significant potential risks require careful treatment set up and patient monitoring constituting a special treatment procedure   STEREOTACTIC TREATMENT MANAGEMENT:  Following delivery, the patient was evaluated clinically. The patient tolerated treatment without significant acute effects, and was discharged to home in stable condition.    PLAN: Continue treatment as planned.  ________________________________  Blair Promise, PhD, MD

## 2014-05-17 ENCOUNTER — Ambulatory Visit: Payer: BC Managed Care – PPO | Admitting: Radiation Oncology

## 2014-05-17 ENCOUNTER — Ambulatory Visit
Admission: RE | Admit: 2014-05-17 | Discharge: 2014-05-17 | Disposition: A | Discharge status: Home / Self Care | Payer: BC Managed Care – PPO | Source: Ambulatory Visit | Attending: Radiation Oncology | Admitting: Radiation Oncology

## 2014-05-17 DIAGNOSIS — C3430 Malignant neoplasm of lower lobe, unspecified bronchus or lung: Secondary | ICD-10-CM

## 2014-05-17 NOTE — Progress Notes (Signed)
   Radiation Oncology         [phone] ________________________________  Name: Alison Green MRN: [id number]  Date: 05/17/2014  DOB: 07-31-1966  Stereotactic Body Radiotherapy Treatment Procedure Note   NARRATIVE: Alison Green was brought to the stereotactic radiation treatment machine and placed supine on the CT couch. The patient was set up for stereotactic body radiotherapy on the body fix pillow.   3D TREATMENT PLANNING AND DOSIMETRY: The patient's radiation plan was reviewed and approved prior to starting treatment. It showed 3-dimensional radiation distributions overlaid onto the planning CT. The [facility] for the target structures as well as the organs at risk were reviewed. The documentation of this is filed in the radiation oncology EMR.   SIMULATION VERIFICATION: The patient underwent CT imaging on the treatment unit. These were carefully aligned to document that the ablative radiation dose would cover the target volume and maximally spare the nearby organs at risk according to the planned distribution.   SPECIAL TREATMENT PROCEDURE: Alison Green received high dose ablative stereotactic body radiotherapy to the planned target volume without unforeseen complications. Treatment was delivered uneventfully. The high doses associated with stereotactic body radiotherapy and the significant potential risks require careful treatment set up and patient monitoring constituting a special treatment procedure.   STEREOTACTIC TREATMENT MANAGEMENT: Following delivery, the patient was evaluated clinically. The patient tolerated treatment without significant acute effects, and was discharged to home in stable condition.   PLAN: Continue treatment as planned.    Fraction: 4  Dose:  40 Gy   ________________________________  Jodelle Gross, MD, PhD

## 2014-05-21 ENCOUNTER — Ambulatory Visit
Admission: RE | Admit: 2014-05-21 | Discharge: 2014-05-21 | Disposition: A | Discharge status: Home / Self Care | Payer: BC Managed Care – PPO | Source: Ambulatory Visit | Attending: Radiation Oncology | Admitting: Radiation Oncology

## 2014-05-21 ENCOUNTER — Encounter: Payer: Self-pay | Admitting: Radiation Oncology

## 2014-05-21 VITALS — BP 119/89 | HR 84 | Temp 98.6°F | Wt 111.2 lb

## 2014-05-21 DIAGNOSIS — C3430 Malignant neoplasm of lower lobe, unspecified bronchus or lung: Secondary | ICD-10-CM

## 2014-05-21 NOTE — Progress Notes (Signed)
  Radiation Oncology         (336) [phone] ________________________________  Name: Alison Green MRN: [id number]  Date: 05/21/2014  DOB: 1966-03-05  Stereotactic Body Radiotherapy Treatment Procedure Note  NARRATIVE:  CALLY NYGARD was brought to the stereotactic radiation treatment machine and placed supine on the CT couch. The patient was set up for stereotactic body radiotherapy on the body fix pillow.  3D TREATMENT PLANNING AND DOSIMETRY:  The patient's radiation plan was reviewed and approved prior to starting treatment.  It showed 3-dimensional radiation distributions overlaid onto the planning CT.  The [facility] for the target structures as well as the organs at risk were reviewed. The documentation of this is filed in the radiation oncology EMR.  SIMULATION VERIFICATION:  The patient underwent CT imaging on the treatment unit.  These were carefully aligned to document that the ablative radiation dose would cover the target volume and maximally spare the nearby organs at risk according to the planned distribution.  SPECIAL TREATMENT PROCEDURE: AMIEE WILEY received high dose ablative stereotactic body radiotherapy to the planned target volume without unforeseen complications. Treatment was delivered uneventfully. The high doses associated with stereotactic body radiotherapy and the significant potential risks require careful treatment set up and patient monitoring constituting a special treatment procedure   STEREOTACTIC TREATMENT MANAGEMENT:  Following delivery, the patient was evaluated clinically. The patient tolerated treatment without significant acute effects, and was discharged to home in stable condition.    PLAN: Continue treatment as planned.  _________________________   Thea Silversmith, MD

## 2014-05-21 NOTE — Progress Notes (Signed)
  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  Narrative: The patient tolerated radiation treatment relatively well.   She had minimal cough  Plan: The patient has completed radiation treatment. The patient will return to radiation oncology clinic for routine followup in July after a CT scan at [facility]. I have also referred her to [facility] for an opinion regarding her primary cancer at her request.  I advised her to call or return sooner if they have any questions or concerns related to her recovery or treatment.  ------------------------------------------------  Thea Silversmith, MD

## 2014-05-21 NOTE — Progress Notes (Signed)
Patient has completed 5 SBRT treatments to right lower lobe.Has cough and shortness of breath only initially after treatment then it clears.Denies pain.No visible skin changes.Mild fatigue.Given appointment card to schedule follow up in one month.Knows to call if any questions or concerns regarding treatment.

## 2014-05-22 ENCOUNTER — Telehealth: Payer: Self-pay | Admitting: *Deleted

## 2014-05-22 NOTE — Telephone Encounter (Signed)
Called patient to inform of test on 06/26/14 - arrival time - 12:30 pm @ [facility] (Radiology) and her ENT visit with Dr. Nicolette Bang on 06-04-14- arrival time 10:15 am , spoke with patient and she is aware of these appts.

## 2014-05-23 NOTE — Addendum Note (Signed)
Encounter addended by: Thea Silversmith, MD on: 05/23/2014  5:55 PM<BR>     Documentation filed: Clinical Notes, Notes Section

## 2014-06-01 ENCOUNTER — Ambulatory Visit: Payer: BC Managed Care – PPO | Admitting: Radiation Oncology

## 2014-06-07 ENCOUNTER — Telehealth: Payer: Self-pay | Admitting: *Deleted

## 2014-06-07 ENCOUNTER — Other Ambulatory Visit: Payer: Self-pay | Admitting: Radiation Oncology

## 2014-06-07 DIAGNOSIS — C3430 Malignant neoplasm of lower lobe, unspecified bronchus or lung: Secondary | ICD-10-CM

## 2014-06-07 NOTE — Telephone Encounter (Signed)
Called patient to inform of additional CT on 06-26-14 @ [facility], - arrival time - 12:30 pm, spoke with patient and she is aware of this test.

## 2014-06-26 ENCOUNTER — Ambulatory Visit (HOSPITAL_COMMUNITY): Payer: BC Managed Care – PPO

## 2014-06-27 ENCOUNTER — Ambulatory Visit: Payer: BC Managed Care – PPO | Admitting: Radiation Oncology

## 2014-07-04 ENCOUNTER — Ambulatory Visit
Admission: RE | Admit: 2014-07-04 | Discharge: 2014-07-04 | Disposition: A | Discharge status: Home / Self Care | Payer: BC Managed Care – PPO | Source: Ambulatory Visit | Attending: Radiation Oncology | Admitting: Radiation Oncology

## 2014-07-04 DIAGNOSIS — C3430 Malignant neoplasm of lower lobe, unspecified bronchus or lung: Secondary | ICD-10-CM

## 2014-07-04 NOTE — Progress Notes (Addendum)
Routine one month follow up completion of palliative  radiation to right lower lobe.Denies pain.Shortness of breath on exertion and occasional non-productive cough.Ct scan reviewed partially with Dr.Lewis.Ct did reveal some improvement and no recurrent disease.

## 2014-07-04 NOTE — Progress Notes (Signed)
   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  She has done well. She has a slight cough which is stable. Otherwise her breathing symptoms are stable. She had a CT and aspirate O. on 06/26/2014 which shows improvement in the 2 treated lesions in the right lower lobe. Some areas in the left upper lobe and right upper lobe were slightly increased in size although accounting for differences in measurement this is really not significant. There is no progressive disease. The area in the right neck was stable. She discussed these findings with Dr. Bobby Rumpf. She was evaluated at [facility] and no further surgery was recommended.  Allergies: No Known Allergies  Medications:  Current Outpatient Prescriptions  Medication Sig Dispense Refill  . diphenhydrAMINE (SIMPLY SLEEP) 25 MG tablet Take 25 mg by mouth at bedtime as needed for sleep.      Marland Kitchen buPROPion (ZYBAN) 150 MG 12 hr tablet Take 150 mg by mouth 2 (two) times a week.        No current facility-administered medications for this encounter.    Physical Exam:  Pleasant thin female in no distress sitting comfortably on examining table  IMAGING: I reviewed the results of the CT scan with her as well as the images.  IMPRESSION: Sumiko is a 48 y.o. female status post radiation to the right lower lobe with excellent response  PLAN:  I discussed the Karle that these areas and the longer may be slightly increased in size or slightly more prominent however the treated lesions have significantly decreased in size. At this point we have discussed and she has  discussed systemic treatment with Dr. Bobby Rumpf. She is really asymptomatic and doesn't have rapidly progressive disease. My daughter be to hold off on systemic treatment right now which is in agreement with. Dr. Bobby Rumpf has a CT scheduled for 3 months from now. I will plan on seeing her back at that time. She will give me a call once she has a CT in followup scheduled. She knows she can always contact me with any questions in the interim.    Thea Silversmith, MD

## 2014-09-23 NOTE — Progress Notes (Signed)
This encounter was created in error - please disregard.

## 2014-10-31 ENCOUNTER — Ambulatory Visit: Payer: Self-pay | Admitting: Radiation Oncology

## 2014-11-01 ENCOUNTER — Ambulatory Visit: Payer: BC Managed Care – PPO | Admitting: Radiation Oncology

## 2014-11-08 ENCOUNTER — Ambulatory Visit
Admission: RE | Admit: 2014-11-08 | Discharge: 2014-11-08 | Disposition: A | Discharge status: Home / Self Care | Payer: BC Managed Care – PPO | Source: Ambulatory Visit | Attending: Radiation Oncology | Admitting: Radiation Oncology

## 2014-11-08 VITALS — BP 126/84 | HR 74 | Temp 98.1°F | Resp 20 | Wt 124.4 lb

## 2014-11-08 DIAGNOSIS — C3431 Malignant neoplasm of lower lobe, right bronchus or lung: Secondary | ICD-10-CM

## 2014-11-11 ENCOUNTER — Encounter: Payer: Self-pay | Admitting: Radiation Oncology

## 2014-11-11 NOTE — Progress Notes (Signed)
   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  She has done well. The cervical lymph node was stable in size with no evidence of progression in the head or neck. The treated lung lesion is smaller with radiation change. There are some new lesions and some growth of previous lesions which I reviewed on the scan myself. She had a cough for which she took prednisone.  She felt "weird" on the prednisone but it helped. 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  She has no hemoptysis or cough. Her weight is stable. She met with Dr. Bobby Rumpf who tested her tumor for mutations and per her report these were "low". She has scans scheduled in February.   Allergies: No Known Allergies  Medications:  Current Outpatient Prescriptions  Medication Sig Dispense Refill  . buPROPion (ZYBAN) 150 MG 12 hr tablet Take 150 mg by mouth 2 (two) times a week.     . diphenhydrAMINE (SIMPLY SLEEP) 25 MG tablet Take 25 mg by mouth at bedtime as needed for sleep.     No current facility-administered medications for this encounter.    Physical Exam:  Pleasant thin female in no distress sitting comfortably on examining table  IMAGING: I reviewed the results of the CT scan with her as well as the images.  IMPRESSION: Joey is a 48 y.o. female status post radiation to the right lower lobe with excellent response to treatment and some growth of lung lesions.   PLAN:  I discussed the Vianey that these areas and the longer may be slightly increased in size or slightly more prominent however the treated lesions  have significantly decreased in size. At this point we have discussed and she has discussed systemic treatment with Dr. Bobby Rumpf. She is really asymptomatic and doesn't have rapidly progressive disease. I agree that we should hold off on systemic treatment right now which is in agreement with Dr. Bobby Rumpf. We discussed thresholds for treatment (1.5 cm? 2 cm? Wait for symptoms?) in terms of her lung nodules. She  has a CT scheduled for 4 months from now. I will plan on seeing her back at that time. She will give me a call once she has a CT in followup scheduled. She knows she can always contact me with any questions in the interim.    Thea Silversmith, MD

## 2015-02-17 IMAGING — CR DG CHEST 1V PORT
1 series · 1 of 1 positions shown · non-contrast
Comparison: 03/08/2014

CLINICAL DATA: Post bronchoscopy and right-sided biopsies.

EXAM:
PORTABLE CHEST - 1 VIEW

[AP]
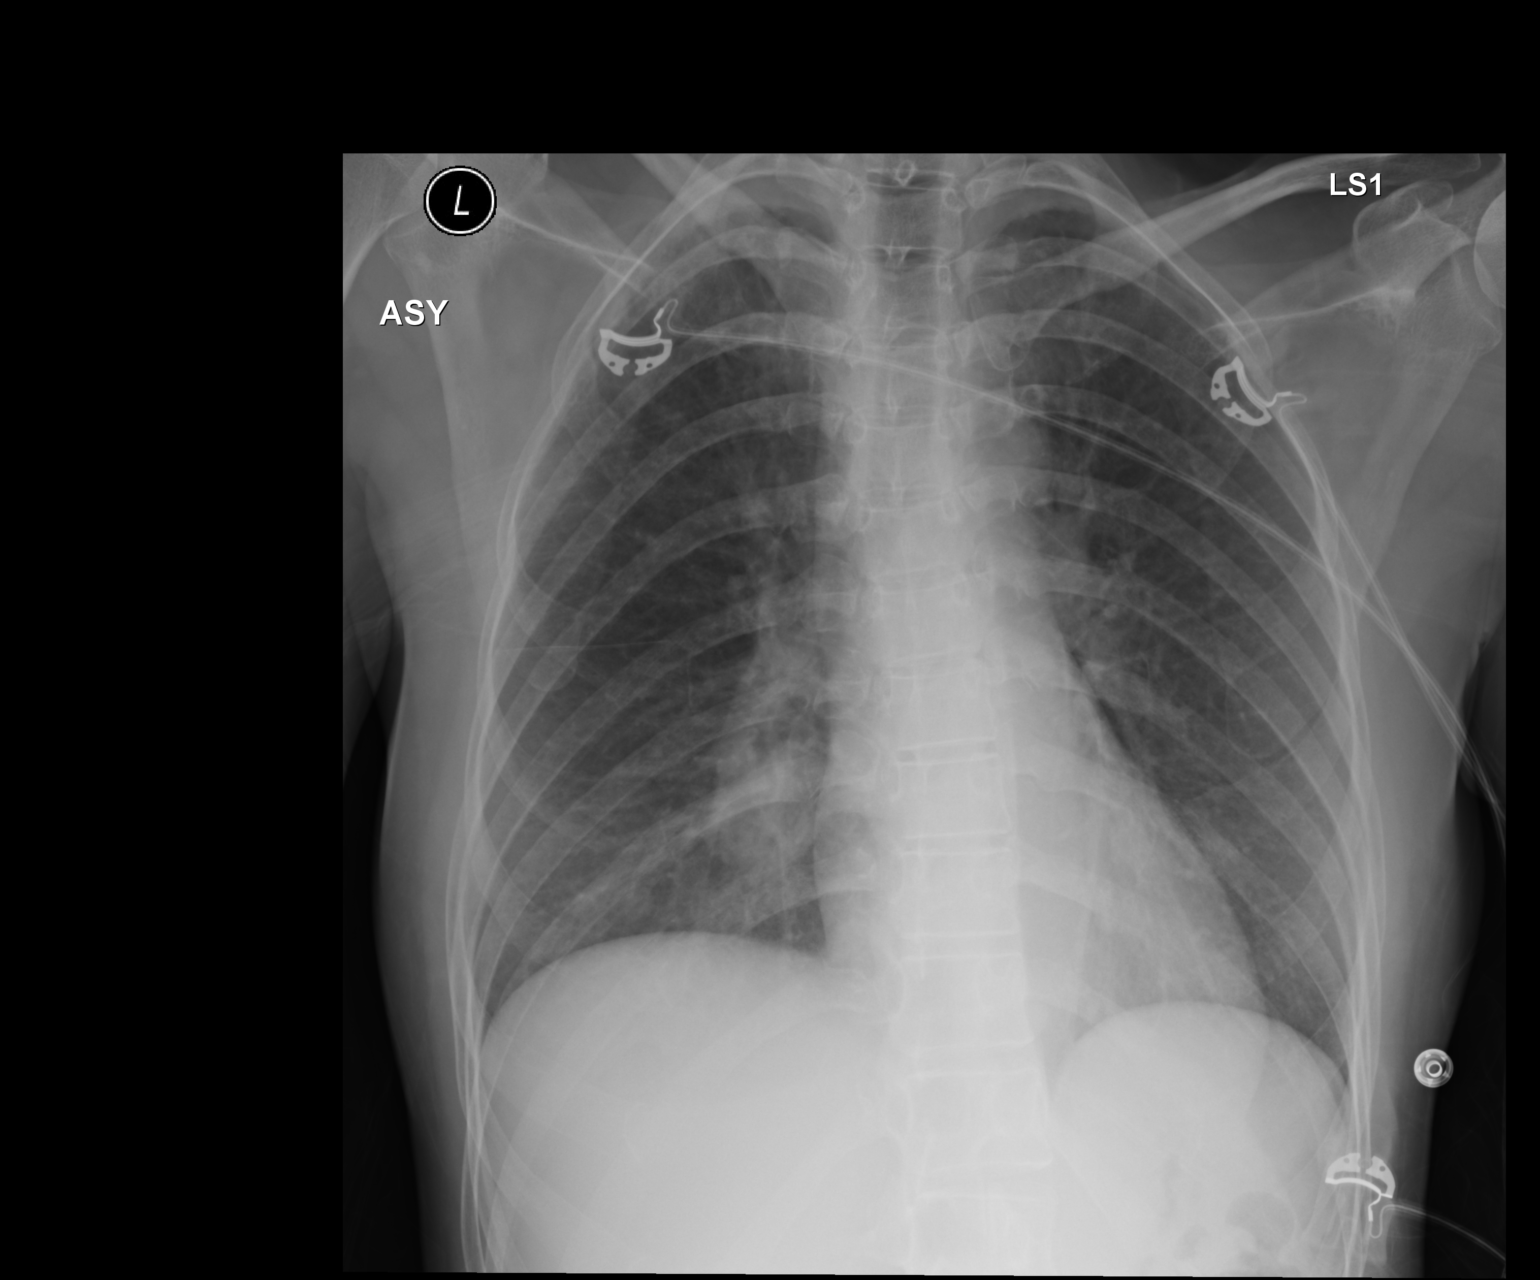

[1 of 1 positions shown; findings below may reference images not displayed]

FINDINGS: The cardiomediastinal silhouette is within normal limits. 3.9 cm
right lower lobe mass does not appear significantly changed. Left
lower lobe parenchymal opacities on the prior study have resolved in
the interim. There is minimal new patchy opacity in the right lung
base. No pneumothorax is identified. No pleural effusion is seen. No
acute osseous abnormality is present.
IMPRESSION: 1. Unchanged appearance of right lower lobe mass. Minimal opacity in
the right lung base likely reflects sequelae of bronchoscopy.
2. Interval clearing of left lower lobe opacity on the prior study.
3. No pneumothorax seen.

## 2015-02-21 ENCOUNTER — Ambulatory Visit: Payer: Self-pay | Admitting: Radiation Oncology

## 2015-07-30 ENCOUNTER — Telehealth: Payer: Self-pay | Admitting: *Deleted

## 2015-07-30 NOTE — Telephone Encounter (Signed)
On 07-30-15 fax medical records to [facility] it was consult note, end of tx note, follow up note.

## 2015-09-29 DIAGNOSIS — C7800 Secondary malignant neoplasm of unspecified lung: Secondary | ICD-10-CM | POA: Diagnosis not present

## 2015-09-29 DIAGNOSIS — C089 Malignant neoplasm of major salivary gland, unspecified: Secondary | ICD-10-CM | POA: Diagnosis not present

## 2015-10-20 DIAGNOSIS — C7800 Secondary malignant neoplasm of unspecified lung: Secondary | ICD-10-CM | POA: Diagnosis not present

## 2015-10-20 DIAGNOSIS — D701 Agranulocytosis secondary to cancer chemotherapy: Secondary | ICD-10-CM | POA: Diagnosis not present

## 2015-10-20 DIAGNOSIS — C089 Malignant neoplasm of major salivary gland, unspecified: Secondary | ICD-10-CM | POA: Diagnosis not present

## 2015-11-25 DIAGNOSIS — C089 Malignant neoplasm of major salivary gland, unspecified: Secondary | ICD-10-CM | POA: Diagnosis not present

## 2015-11-25 DIAGNOSIS — C7800 Secondary malignant neoplasm of unspecified lung: Secondary | ICD-10-CM | POA: Diagnosis not present

## 2015-11-25 DIAGNOSIS — E039 Hypothyroidism, unspecified: Secondary | ICD-10-CM | POA: Diagnosis not present

## 2015-12-16 DIAGNOSIS — C089 Malignant neoplasm of major salivary gland, unspecified: Secondary | ICD-10-CM | POA: Diagnosis not present

## 2015-12-16 DIAGNOSIS — C7800 Secondary malignant neoplasm of unspecified lung: Secondary | ICD-10-CM | POA: Diagnosis not present

## 2016-01-13 DIAGNOSIS — R0789 Other chest pain: Secondary | ICD-10-CM | POA: Diagnosis not present

## 2016-01-13 DIAGNOSIS — C7800 Secondary malignant neoplasm of unspecified lung: Secondary | ICD-10-CM | POA: Diagnosis not present

## 2016-01-13 DIAGNOSIS — C801 Malignant (primary) neoplasm, unspecified: Secondary | ICD-10-CM | POA: Diagnosis not present

## 2016-02-03 DIAGNOSIS — C089 Malignant neoplasm of major salivary gland, unspecified: Secondary | ICD-10-CM | POA: Diagnosis not present

## 2016-02-03 DIAGNOSIS — C7800 Secondary malignant neoplasm of unspecified lung: Secondary | ICD-10-CM | POA: Diagnosis not present

## 2016-03-02 DIAGNOSIS — C7800 Secondary malignant neoplasm of unspecified lung: Secondary | ICD-10-CM | POA: Diagnosis not present

## 2016-03-02 DIAGNOSIS — D696 Thrombocytopenia, unspecified: Secondary | ICD-10-CM | POA: Diagnosis not present

## 2016-03-02 DIAGNOSIS — C089 Malignant neoplasm of major salivary gland, unspecified: Secondary | ICD-10-CM | POA: Diagnosis not present

## 2016-03-30 DIAGNOSIS — C089 Malignant neoplasm of major salivary gland, unspecified: Secondary | ICD-10-CM | POA: Diagnosis not present

## 2016-03-30 DIAGNOSIS — C7800 Secondary malignant neoplasm of unspecified lung: Secondary | ICD-10-CM | POA: Diagnosis not present

## 2016-05-18 DIAGNOSIS — C089 Malignant neoplasm of major salivary gland, unspecified: Secondary | ICD-10-CM | POA: Diagnosis not present

## 2016-05-18 DIAGNOSIS — C7800 Secondary malignant neoplasm of unspecified lung: Secondary | ICD-10-CM | POA: Diagnosis not present

## 2016-06-08 DIAGNOSIS — C7802 Secondary malignant neoplasm of left lung: Secondary | ICD-10-CM | POA: Diagnosis not present

## 2016-06-08 DIAGNOSIS — D701 Agranulocytosis secondary to cancer chemotherapy: Secondary | ICD-10-CM | POA: Diagnosis not present

## 2016-06-08 DIAGNOSIS — C7801 Secondary malignant neoplasm of right lung: Secondary | ICD-10-CM | POA: Diagnosis not present

## 2016-06-08 DIAGNOSIS — C801 Malignant (primary) neoplasm, unspecified: Secondary | ICD-10-CM | POA: Diagnosis not present

## 2016-06-30 DIAGNOSIS — C7800 Secondary malignant neoplasm of unspecified lung: Secondary | ICD-10-CM

## 2016-06-30 DIAGNOSIS — C801 Malignant (primary) neoplasm, unspecified: Secondary | ICD-10-CM

## 2016-07-21 DIAGNOSIS — C7800 Secondary malignant neoplasm of unspecified lung: Secondary | ICD-10-CM | POA: Diagnosis not present

## 2016-07-21 DIAGNOSIS — C782 Secondary malignant neoplasm of pleura: Secondary | ICD-10-CM | POA: Diagnosis not present

## 2016-07-21 DIAGNOSIS — C089 Malignant neoplasm of major salivary gland, unspecified: Secondary | ICD-10-CM | POA: Diagnosis not present

## 2016-08-11 DIAGNOSIS — C7800 Secondary malignant neoplasm of unspecified lung: Secondary | ICD-10-CM

## 2016-08-11 DIAGNOSIS — C089 Malignant neoplasm of major salivary gland, unspecified: Secondary | ICD-10-CM

## 2016-09-01 DIAGNOSIS — C7800 Secondary malignant neoplasm of unspecified lung: Secondary | ICD-10-CM | POA: Diagnosis not present

## 2016-09-01 DIAGNOSIS — G620 Drug-induced polyneuropathy: Secondary | ICD-10-CM | POA: Diagnosis not present

## 2016-09-01 DIAGNOSIS — D701 Agranulocytosis secondary to cancer chemotherapy: Secondary | ICD-10-CM | POA: Diagnosis not present

## 2016-09-01 DIAGNOSIS — C089 Malignant neoplasm of major salivary gland, unspecified: Secondary | ICD-10-CM | POA: Diagnosis not present

## 2016-10-13 DIAGNOSIS — C7800 Secondary malignant neoplasm of unspecified lung: Secondary | ICD-10-CM | POA: Diagnosis not present

## 2016-10-13 DIAGNOSIS — G620 Drug-induced polyneuropathy: Secondary | ICD-10-CM | POA: Diagnosis not present

## 2016-10-13 DIAGNOSIS — C089 Malignant neoplasm of major salivary gland, unspecified: Secondary | ICD-10-CM | POA: Diagnosis not present

## 2016-11-03 DIAGNOSIS — C089 Malignant neoplasm of major salivary gland, unspecified: Secondary | ICD-10-CM | POA: Diagnosis not present

## 2016-11-03 DIAGNOSIS — C7800 Secondary malignant neoplasm of unspecified lung: Secondary | ICD-10-CM | POA: Diagnosis not present

## 2016-11-24 DIAGNOSIS — C089 Malignant neoplasm of major salivary gland, unspecified: Secondary | ICD-10-CM | POA: Diagnosis not present

## 2016-11-24 DIAGNOSIS — G620 Drug-induced polyneuropathy: Secondary | ICD-10-CM | POA: Diagnosis not present

## 2016-11-24 DIAGNOSIS — C7800 Secondary malignant neoplasm of unspecified lung: Secondary | ICD-10-CM | POA: Diagnosis not present

## 2017-01-19 DIAGNOSIS — C089 Malignant neoplasm of major salivary gland, unspecified: Secondary | ICD-10-CM | POA: Diagnosis not present

## 2017-01-19 DIAGNOSIS — G620 Drug-induced polyneuropathy: Secondary | ICD-10-CM | POA: Diagnosis not present

## 2017-01-19 DIAGNOSIS — C7800 Secondary malignant neoplasm of unspecified lung: Secondary | ICD-10-CM | POA: Diagnosis not present

## 2017-02-16 DIAGNOSIS — C7800 Secondary malignant neoplasm of unspecified lung: Secondary | ICD-10-CM | POA: Diagnosis not present

## 2017-02-16 DIAGNOSIS — C089 Malignant neoplasm of major salivary gland, unspecified: Secondary | ICD-10-CM | POA: Diagnosis not present

## 2017-03-14 DIAGNOSIS — C7800 Secondary malignant neoplasm of unspecified lung: Secondary | ICD-10-CM | POA: Diagnosis not present

## 2017-03-14 DIAGNOSIS — C089 Malignant neoplasm of major salivary gland, unspecified: Secondary | ICD-10-CM | POA: Diagnosis not present

## 2017-04-04 DIAGNOSIS — C089 Malignant neoplasm of major salivary gland, unspecified: Secondary | ICD-10-CM | POA: Diagnosis not present

## 2017-04-04 DIAGNOSIS — C7800 Secondary malignant neoplasm of unspecified lung: Secondary | ICD-10-CM | POA: Diagnosis not present

## 2017-04-25 DIAGNOSIS — C7802 Secondary malignant neoplasm of left lung: Secondary | ICD-10-CM

## 2017-04-25 DIAGNOSIS — C089 Malignant neoplasm of major salivary gland, unspecified: Secondary | ICD-10-CM | POA: Diagnosis not present

## 2017-04-25 DIAGNOSIS — C7801 Secondary malignant neoplasm of right lung: Secondary | ICD-10-CM

## 2017-05-25 DIAGNOSIS — C089 Malignant neoplasm of major salivary gland, unspecified: Secondary | ICD-10-CM | POA: Diagnosis not present

## 2017-05-25 DIAGNOSIS — C7800 Secondary malignant neoplasm of unspecified lung: Secondary | ICD-10-CM

## 2017-06-15 DIAGNOSIS — C089 Malignant neoplasm of major salivary gland, unspecified: Secondary | ICD-10-CM | POA: Diagnosis not present

## 2017-06-15 DIAGNOSIS — C7800 Secondary malignant neoplasm of unspecified lung: Secondary | ICD-10-CM | POA: Diagnosis not present

## 2017-06-15 DIAGNOSIS — D649 Anemia, unspecified: Secondary | ICD-10-CM | POA: Diagnosis not present

## 2017-07-05 DIAGNOSIS — C782 Secondary malignant neoplasm of pleura: Secondary | ICD-10-CM | POA: Diagnosis not present

## 2017-07-05 DIAGNOSIS — D649 Anemia, unspecified: Secondary | ICD-10-CM | POA: Diagnosis not present

## 2017-07-05 DIAGNOSIS — C089 Malignant neoplasm of major salivary gland, unspecified: Secondary | ICD-10-CM | POA: Diagnosis not present

## 2017-07-26 DIAGNOSIS — C7800 Secondary malignant neoplasm of unspecified lung: Secondary | ICD-10-CM | POA: Diagnosis not present

## 2017-07-26 DIAGNOSIS — C089 Malignant neoplasm of major salivary gland, unspecified: Secondary | ICD-10-CM | POA: Diagnosis not present

## 2017-07-26 DIAGNOSIS — D649 Anemia, unspecified: Secondary | ICD-10-CM | POA: Diagnosis not present

## 2017-08-16 DIAGNOSIS — D649 Anemia, unspecified: Secondary | ICD-10-CM | POA: Diagnosis not present

## 2017-08-16 DIAGNOSIS — C089 Malignant neoplasm of major salivary gland, unspecified: Secondary | ICD-10-CM | POA: Diagnosis not present

## 2017-09-06 DIAGNOSIS — D649 Anemia, unspecified: Secondary | ICD-10-CM | POA: Diagnosis not present

## 2017-09-06 DIAGNOSIS — C089 Malignant neoplasm of major salivary gland, unspecified: Secondary | ICD-10-CM | POA: Diagnosis not present

## 2017-09-06 DIAGNOSIS — C782 Secondary malignant neoplasm of pleura: Secondary | ICD-10-CM | POA: Diagnosis not present

## 2017-09-06 DIAGNOSIS — C7951 Secondary malignant neoplasm of bone: Secondary | ICD-10-CM | POA: Diagnosis not present

## 2017-10-04 DIAGNOSIS — C7800 Secondary malignant neoplasm of unspecified lung: Secondary | ICD-10-CM | POA: Diagnosis not present

## 2017-10-04 DIAGNOSIS — C089 Malignant neoplasm of major salivary gland, unspecified: Secondary | ICD-10-CM | POA: Diagnosis not present

## 2017-10-04 DIAGNOSIS — G893 Neoplasm related pain (acute) (chronic): Secondary | ICD-10-CM | POA: Diagnosis not present

## 2017-10-10 DIAGNOSIS — Z0001 Encounter for general adult medical examination with abnormal findings: Secondary | ICD-10-CM | POA: Diagnosis not present

## 2017-11-01 DIAGNOSIS — C787 Secondary malignant neoplasm of liver and intrahepatic bile duct: Secondary | ICD-10-CM | POA: Diagnosis not present

## 2017-11-01 DIAGNOSIS — Z86711 Personal history of pulmonary embolism: Secondary | ICD-10-CM | POA: Diagnosis not present

## 2017-11-01 DIAGNOSIS — C089 Malignant neoplasm of major salivary gland, unspecified: Secondary | ICD-10-CM | POA: Diagnosis not present

## 2017-11-01 DIAGNOSIS — C7800 Secondary malignant neoplasm of unspecified lung: Secondary | ICD-10-CM | POA: Diagnosis not present

## 2017-11-01 DIAGNOSIS — Z7901 Long term (current) use of anticoagulants: Secondary | ICD-10-CM | POA: Diagnosis not present

## 2017-11-15 DIAGNOSIS — C787 Secondary malignant neoplasm of liver and intrahepatic bile duct: Secondary | ICD-10-CM | POA: Diagnosis not present

## 2017-11-15 DIAGNOSIS — Z86711 Personal history of pulmonary embolism: Secondary | ICD-10-CM | POA: Diagnosis not present

## 2017-11-15 DIAGNOSIS — C089 Malignant neoplasm of major salivary gland, unspecified: Secondary | ICD-10-CM | POA: Diagnosis not present

## 2017-11-15 DIAGNOSIS — C7800 Secondary malignant neoplasm of unspecified lung: Secondary | ICD-10-CM | POA: Diagnosis not present

## 2018-01-27 DEATH — deceased
